# Patient Record
Sex: Male | Born: 1935 | Race: White | Hispanic: No | Marital: Married | Smoking: Never smoker
Health system: Southern US, Community
[De-identification: ages and names within clinical notes are randomized; demographics above are authoritative.]

## PROBLEM LIST (undated history)

## (undated) DIAGNOSIS — C801 Malignant (primary) neoplasm, unspecified: Secondary | ICD-10-CM

## (undated) DIAGNOSIS — Z66 Do not resuscitate: Secondary | ICD-10-CM

## (undated) DIAGNOSIS — N200 Calculus of kidney: Secondary | ICD-10-CM

## (undated) DIAGNOSIS — E079 Disorder of thyroid, unspecified: Secondary | ICD-10-CM

## (undated) HISTORY — DX: Calculus of kidney: N20.0

## (undated) HISTORY — PX: PROSTATE SURGERY: SHX751

## (undated) HISTORY — DX: Disorder of thyroid, unspecified: E07.9

## (undated) HISTORY — DX: Malignant (primary) neoplasm, unspecified: C80.1

## (undated) HISTORY — DX: Do not resuscitate: Z66

## (undated) HISTORY — PX: HEMORRHOID SURGERY: SHX153

---

## 1998-05-12 ENCOUNTER — Ambulatory Visit (HOSPITAL_COMMUNITY): Admission: RE | Admit: 1998-05-12 | Discharge: 1998-05-12 | Payer: Self-pay | Admitting: Interventional Cardiology

## 2004-06-04 ENCOUNTER — Ambulatory Visit: Payer: Self-pay | Admitting: Urology

## 2004-06-08 ENCOUNTER — Ambulatory Visit: Payer: Self-pay | Admitting: Urology

## 2005-07-22 ENCOUNTER — Ambulatory Visit: Payer: Self-pay | Admitting: Urology

## 2005-07-26 ENCOUNTER — Ambulatory Visit: Payer: Self-pay | Admitting: Urology

## 2005-08-17 ENCOUNTER — Other Ambulatory Visit: Payer: Self-pay

## 2005-08-17 ENCOUNTER — Ambulatory Visit: Payer: Self-pay | Admitting: Urology

## 2005-08-19 ENCOUNTER — Ambulatory Visit: Payer: Self-pay | Admitting: Urology

## 2005-10-12 ENCOUNTER — Ambulatory Visit: Payer: Self-pay | Admitting: Urology

## 2005-10-12 ENCOUNTER — Other Ambulatory Visit: Payer: Self-pay

## 2005-10-14 ENCOUNTER — Ambulatory Visit: Payer: Self-pay | Admitting: Urology

## 2006-03-01 DIAGNOSIS — C801 Malignant (primary) neoplasm, unspecified: Secondary | ICD-10-CM

## 2006-03-01 HISTORY — DX: Malignant (primary) neoplasm, unspecified: C80.1

## 2006-06-07 ENCOUNTER — Ambulatory Visit (HOSPITAL_COMMUNITY): Admission: RE | Admit: 2006-06-07 | Discharge: 2006-06-07 | Payer: Self-pay | Admitting: Urology

## 2006-06-28 ENCOUNTER — Encounter (INDEPENDENT_AMBULATORY_CARE_PROVIDER_SITE_OTHER): Payer: Self-pay | Admitting: Specialist

## 2006-06-28 ENCOUNTER — Inpatient Hospital Stay (HOSPITAL_COMMUNITY): Admission: RE | Admit: 2006-06-28 | Discharge: 2006-06-28 | Payer: Self-pay | Admitting: Urology

## 2006-07-12 ENCOUNTER — Ambulatory Visit: Admission: RE | Admit: 2006-07-12 | Discharge: 2006-11-27 | Payer: Self-pay | Admitting: Radiation Oncology

## 2006-09-12 ENCOUNTER — Ambulatory Visit: Admission: RE | Admit: 2006-09-12 | Discharge: 2006-11-27 | Payer: Self-pay | Admitting: Radiation Oncology

## 2006-10-05 LAB — URINALYSIS, MICROSCOPIC - CHCC
Nitrite: NEGATIVE
Protein: NEGATIVE mg/dL
pH: 5 (ref 4.6–8.0)

## 2007-11-22 ENCOUNTER — Ambulatory Visit: Payer: Self-pay | Admitting: Urology

## 2007-11-29 ENCOUNTER — Ambulatory Visit: Payer: Self-pay | Admitting: Urology

## 2007-12-08 ENCOUNTER — Ambulatory Visit: Payer: Self-pay | Admitting: Urology

## 2007-12-20 ENCOUNTER — Ambulatory Visit: Payer: Self-pay | Admitting: Urology

## 2007-12-21 ENCOUNTER — Ambulatory Visit: Payer: Self-pay | Admitting: Urology

## 2007-12-28 ENCOUNTER — Ambulatory Visit: Payer: Self-pay | Admitting: Urology

## 2008-01-24 ENCOUNTER — Ambulatory Visit: Payer: Self-pay | Admitting: Urology

## 2008-02-01 ENCOUNTER — Ambulatory Visit: Payer: Self-pay | Admitting: Urology

## 2008-07-24 ENCOUNTER — Ambulatory Visit: Payer: Self-pay | Admitting: Urology

## 2009-01-06 ENCOUNTER — Ambulatory Visit: Payer: Self-pay | Admitting: Urology

## 2009-07-07 ENCOUNTER — Ambulatory Visit: Payer: Self-pay | Admitting: General Practice

## 2009-08-28 ENCOUNTER — Ambulatory Visit: Payer: Self-pay | Admitting: General Practice

## 2009-10-10 ENCOUNTER — Ambulatory Visit: Payer: Self-pay | Admitting: Urology

## 2010-01-06 ENCOUNTER — Ambulatory Visit: Payer: Self-pay | Admitting: Urology

## 2010-07-14 ENCOUNTER — Ambulatory Visit: Payer: Self-pay | Admitting: Urology

## 2010-07-14 NOTE — Op Note (Signed)
NAME:  Dylan Perez, Dylan Perez NO.:  [id number]   MEDICAL RECORD NO.:  [id number]          PATIENT TYPE:  INP   LOCATION:  0005                         FACILITY:  [facility]   PHYSICIAN:  Boston Service, M.D.DATE OF BIRTH:  [birth year]   DATE OF PROCEDURE:  06/28/2006  DATE OF DISCHARGE:                               OPERATIVE REPORT   PREOPERATIVE DIAGNOSES:  1. Obstructive uropathy.  2. Adhesions within the prostatic urethra.  3. Benign prostatic hypertrophy.  4. Bladder stones.   POSTOPERATIVE DIAGNOSES:  1. Obstructive uropathy.  2. Adhesions within the prostatic urethra.  3. Benign prostatic hypertrophy.  4. Bladder stones.   Endoscopic photographs were taken.   PROCEDURES:  1. Cystoscopy.  2. Laser incision of adhesions within the prostatic urethra.  3. Evacuation of bladder stones.   SURGEON:  Boston Service, M.D.   ASSISTANT:  None.   ANESTHESIA:  General.   SPECIMENS:  Stones.   ESTIMATED BLOOD LOSS:  Minimal.   COMPLICATIONS:  None obvious.   DESCRIPTION OF PROCEDURE:  The patient was prepped and draped in the  dorsal lithotomy position after institution of adequate level of general  anesthesia.  Well lubricated 21-French panendoscope was gently inserted  at the urethral meatus, normal urethra and sphincter.  Prostate showed  evidence of prior resection as well as evidence of prior TUMT, unusual  appearance in that the prostatic urethra proximal to the verumontanum  was densely adherent to the anterior prostate, creating a channel fairly  narrow on either side of the adhesion.  In the office, the flexible  cystoscope had been able to negotiate around the adhesion into the  bladder and there were numerous bladder stones seen, however, I could  not negotiate the 21-French scope beyond the adhesions.  A 500 holmium  fiber was selected, positioned within the cystoscope.  A ureteral  catheter had been advanced through the sideport into the  bladder and the  adhesion was then taken down slowly over a period of about 15-20  minutes.  As soon as the adhesion was released, the posterior aspect of  the prostatic urethra appeared to descend.  There was no visible  obstruction at the bladder neck.  A fairly wide golf hole orifice on the  right and a more slit-like orifice on the left, numerous stones  identified within the bladder.  Laser fiber was put on standby.  Bladder  stones were then irrigated from the bladder.  Careful inspection of the  prostatic urethra indicated that repeat TURP or TUIP was probably not  necessary.  Bladder was filled to capacity.  Cystoscope was withdrawn,  22-French Foley was inserted with immediate return of several hundred mL  of clear irrigant.  Foley was left to straight drain.  The patient was  returned to recovery in satisfactory condition.           ______________________________  Boston Service, M.D.     RH/MEDQ  D:  06/28/2006  T:  06/28/2006  Job:  [id number]   cc:   Alan Mulder, M.D.  Fax: [phone]   Anola Gurney  Fax: [phone]

## 2010-07-14 NOTE — H&P (Signed)
NAME:  Dylan Perez, Dylan Perez NO.:  [id number]   MEDICAL RECORD NO.:  [id number]          PATIENT TYPE:  INP   LOCATION:  0005                         FACILITY:  [facility]   PHYSICIAN:  Boston Service, M.D.DATE OF BIRTH:  19-Aug-1920   DATE OF ADMISSION:  06/28/2006  DATE OF DISCHARGE:                              HISTORY & PHYSICAL   INDICATIONS:  An 77 year old white male, recently discovered prostate  cancer, obstructive uropathy and bladder stones, right and left renal  stones.   PROBLEM LIST:  1. Gratefully appreciate concise and relevant clinical notes forwarded      from [place].  Previous prostate biopsy 1998, 2000 and 2003, M.      Evelene Croon, MD, [place], [place].  Left ESWL and TUMT 2003, M.      Evelene Croon, MD, [place], [place].  Right ESWL in 2000, M.      Evelene Croon, MD, in [place].  2. First seen in [facility] 2008.  Ultrasound, KUB and a      showed an 11.8 cm right kidney with 8 and 11 mm calculi in the      lower pole, nonobstructive.  An 11.2 cm left kidney with 9 and 17      mm stones within the midpole, also nonobstructive.  Cystoscopy      showed adhesions within the prostatic urethra.  The area just      proximal to the verumontanum was adherent to the anterior prostate      and the patient had a collection of small bladder stones.  PSA was      19 for unexplained reasons, which prompted prostate ultrasound      May 27, 2006, showed a Gleason 7 adenocarcinoma, 20% on the      right.  3. Bone scan June 07, 2006:  Degenerative joint disease but no      metastatic disease.  CT June 07, 2006, showed an enlarged prostate      but no evidence of metastatic disease.  I had lengthy discussion      with this patient and his wife.  His daughter Claris Che has been a      Engineer, civil (consulting) here at [employer] for many, many years.  I have tried to      outline with them what I think are the risks and the benefits and      the alternatives associated  with treatment of his recently-      discovered prostate cancer, obstructive uropathy with bladder      stones, and currently unobstructed right and left renal stones.  We      agreed on cystoscopy under anesthesia with lysis of adhesions      within the prostate with TURP and TUIP if necessary, probable ERT      for CAP once the patient is well-recovered from TURP, and will      follow stones conservatively for now.   MEDICATIONS:  Levoxyl, metoprolol, Zocor, Flomax.  Aspirin was  discontinued.   ALLERGIES:  Denies.   HOSPITALIZATION AND SURGERY:  As indicated, follow-up with Dr. Johny Chess  for hypertension and hypothyroidism.  The patient had a recent stress  test here in [place] with Lyn Records, MD.   FAMILY MEDICAL HISTORY:  Dad died at 57, colon cancer in CVA.  Mom died  at 50, Alzheimer's.  The patient is accompanied by his four for  daughters.   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 [employer],  looks and acts younger than his stated age.  VITAL SIGNS:  BP 149/89, temperature is 97, pulse is 84, respirations  20.  Weight is stable at about 186.  HEAD AND NECK:  Negative adenopathy, negative bruit.  LUNGS:  Clear to P&A.  HEART:  Regular rate and rhythm without murmur or gallop.  Appreciate  input from Verdis Prime, MD, and recent stress test.  ABDOMEN:  Positive bowel sounds, soft, nontender, no evidence of  inguinal hernia,  GENITOURINARY:  PSA is 17.  RECTAL:  No evidence of nodularity.  Bilaterally descended testes.  No  evidence of recurrent hernia.  NEUROLOGIC:  Grossly intact.   PLAN:  Will proceed with surgery this a.m.           ______________________________  Boston Service, M.D.     RH/MEDQ  D:  06/28/2006  T:  06/28/2006  Job:  [id number]   cc:   Alan Mulder, M.D.  Fax: [phone]   Anola Gurney  Fax: [phone]

## 2010-10-02 ENCOUNTER — Ambulatory Visit: Payer: Self-pay | Admitting: Internal Medicine

## 2010-10-06 ENCOUNTER — Encounter: Payer: Self-pay | Admitting: Internal Medicine

## 2010-10-19 ENCOUNTER — Telehealth: Payer: Self-pay | Admitting: Internal Medicine

## 2010-10-19 MED ORDER — LEVOTHYROXINE SODIUM 75 MCG PO TABS: 75.0000 ug | ORAL_TABLET | Freq: Every day | ORAL | Status: DC

## 2010-10-19 NOTE — Telephone Encounter (Signed)
OK TO REFILL  WITH 3 ADDITIONAL REFILLS  

## 2010-10-19 NOTE — Telephone Encounter (Signed)
Patient called and requested a refill on Levoxyl 75 mcg.  He has an appt. In 3 months.

## 2010-11-19 ENCOUNTER — Other Ambulatory Visit: Payer: Self-pay | Admitting: Internal Medicine

## 2010-11-19 NOTE — Telephone Encounter (Signed)
Rx request sent to Dr. Darrick Huntsman

## 2010-11-20 MED ORDER — METOPROLOL TARTRATE 50 MG PO TABS: 25.0000 mg | ORAL_TABLET | Freq: Every day | ORAL | Status: DC

## 2011-01-18 ENCOUNTER — Encounter: Payer: Self-pay | Admitting: Internal Medicine

## 2011-01-19 ENCOUNTER — Ambulatory Visit (INDEPENDENT_AMBULATORY_CARE_PROVIDER_SITE_OTHER): Payer: Medicare Other | Admitting: Internal Medicine

## 2011-01-19 ENCOUNTER — Encounter: Payer: Self-pay | Admitting: Internal Medicine

## 2011-01-19 DIAGNOSIS — N200 Calculus of kidney: Secondary | ICD-10-CM | POA: Insufficient documentation

## 2011-01-19 DIAGNOSIS — R269 Unspecified abnormalities of gait and mobility: Secondary | ICD-10-CM

## 2011-01-19 DIAGNOSIS — R2681 Unsteadiness on feet: Secondary | ICD-10-CM

## 2011-01-19 DIAGNOSIS — Z23 Encounter for immunization: Secondary | ICD-10-CM

## 2011-01-19 DIAGNOSIS — Z9889 Other specified postprocedural states: Secondary | ICD-10-CM

## 2011-01-19 DIAGNOSIS — Z8546 Personal history of malignant neoplasm of prostate: Secondary | ICD-10-CM | POA: Insufficient documentation

## 2011-01-19 DIAGNOSIS — M6281 Muscle weakness (generalized): Secondary | ICD-10-CM

## 2011-01-19 NOTE — Assessment & Plan Note (Signed)
Persistent, with prior evaluation in  July revealing no signs of myopathy, b12 deficiency ,thyroid imbalance  etc.  I have suggested PT evaluation both for weakness and vestibular  fxn but he has declined at this time.

## 2011-01-19 NOTE — Patient Instructions (Signed)
I encourage you to walk fro 15 minutes every day on a level surface t keep yor muscles toned.  If you decide to try the Physical Therapy for dizzness,  I will be happy to refer you.  followup in 6 months.

## 2011-01-19 NOTE — Progress Notes (Signed)
Subjective:    Patient ID: Dylan Perez, male    DOB: 1920-06-12, 77 y.o.   MRN: [id number]  HPI  77 yo white male with history of prostate cancer diagnosed in 2008, treated with XRT/hormones, hypothyroidism presents for 6 month followup.  During his first visit in July he was describin progressive bilateral LE weakness and balance issues which has been persistent but for at least 8 months.  Hr denies falls, true vertigo,  orthostasis and back pain . No trouble rising from seated position but feels like he is falling backwards when going up a flight of stairs. His appetite remains poor acocroding to his wife; he disagrees,  Bur she notes conitnued weight loss.  Past Medical History  Diagnosis Date  . DNR (do not resuscitate)   . Cancer 2008    Prostate, treated with radiation and Lupron  . Nephrolithiasis     secondary nephrolithiasis,  [facility]  . hypothyroidism   . Nephrolithiasis     managed by [facility] in [facility]   Current Outpatient Prescriptions on File Prior to Visit  Medication Sig Dispense Refill  . aspirin 81 MG tablet Take 81 mg by mouth daily.        Marland Kitchen levothyroxine (SYNTHROID, LEVOTHROID) 75 MCG tablet Take 1 tablet (75 mcg total) by mouth daily.  30 tablet  3  . metoprolol (LOPRESSOR) 50 MG tablet Take 0.5 tablets (25 mg total) by mouth daily.  30 tablet  3  . Multiple Vitamin (MULTIVITAMIN) tablet Take 1 tablet by mouth daily.        . potassium chloride (MICRO-K) 10 MEQ CR capsule Take 10 mEq by mouth 2 (two) times daily.           Review of Systems  Constitutional: Negative for fever, chills, diaphoresis, activity change, appetite change, fatigue and unexpected weight change.  HENT: Negative for hearing loss, ear pain, nosebleeds, congestion, sore throat, facial swelling, rhinorrhea, sneezing, drooling, mouth sores, trouble swallowing, neck pain, neck stiffness, dental problem, voice change, postnasal drip, sinus pressure, tinnitus and ear discharge.     Eyes: Negative for photophobia, pain, discharge, redness, itching and visual disturbance.  Respiratory: Negative for apnea, cough, choking, chest tightness, shortness of breath, wheezing and stridor.   Cardiovascular: Negative for chest pain, palpitations and leg swelling.  Gastrointestinal: Negative for nausea, vomiting, abdominal pain, diarrhea, constipation, blood in stool, abdominal distention, anal bleeding and rectal pain.  Genitourinary: Negative for dysuria, urgency, frequency, hematuria, flank pain, decreased urine volume, scrotal swelling, difficulty urinating and testicular pain.  Musculoskeletal: Negative for myalgias, back pain, joint swelling, arthralgias and gait problem.  Skin: Negative for color change, rash and wound.  Neurological: Positive for dizziness and weakness. Negative for tremors, seizures, syncope, speech difficulty, light-headedness, numbness and headaches.  Psychiatric/Behavioral: Negative for suicidal ideas, hallucinations, behavioral problems, confusion, sleep disturbance, dysphoric mood, decreased concentration and agitation. The patient is not nervous/anxious.       BP 138/76  Pulse 62  Temp(Src) 97.6 F (36.4 C) (Oral)  Resp 16  Ht 5\' 8"  (1.727 m)  Wt 167 lb 8 oz (75.978 kg)  BMI 25.47 kg/m2  SpO2 97%  Objective:   Physical Exam  Constitutional: He is oriented to person, place, and time.  HENT:  Head: Normocephalic and atraumatic.  Mouth/Throat: Oropharynx is clear and moist.  Eyes: Conjunctivae and EOM are normal.  Neck: Normal range of motion. Neck supple. No JVD present. No thyromegaly present.  Cardiovascular: Normal rate, regular rhythm and normal  heart sounds.   Pulmonary/Chest: Effort normal and breath sounds normal. He has no wheezes. He has no rales.  Abdominal: Soft. Bowel sounds are normal. He exhibits no mass. There is no tenderness. There is no rebound.  Musculoskeletal: Normal range of motion. He exhibits no edema.  Neurological: He  is alert and oriented to person, place, and time. He displays abnormal reflex. No cranial nerve deficit. He exhibits abnormal muscle tone.  Reflex Scores:      Patellar reflexes are 0 on the right side and 0 on the left side.      Achilles reflexes are 0 on the right side and 0 on the left side.      Right leg distal strength 4/5  Skin: Skin is warm and dry.  Psychiatric: He has a normal mood and affect.          Assessment & Plan:

## 2011-02-03 ENCOUNTER — Ambulatory Visit: Payer: Self-pay | Admitting: Ophthalmology

## 2011-02-04 ENCOUNTER — Encounter: Payer: Self-pay | Admitting: Internal Medicine

## 2011-02-16 ENCOUNTER — Ambulatory Visit: Payer: Self-pay | Admitting: Ophthalmology

## 2011-02-18 ENCOUNTER — Telehealth: Payer: Self-pay | Admitting: *Deleted

## 2011-02-18 NOTE — Telephone Encounter (Signed)
Refill on levothyroxine #30

## 2011-02-19 MED ORDER — LEVOTHYROXINE SODIUM 75 MCG PO TABS: 75.0000 ug | ORAL_TABLET | Freq: Every day | ORAL | Status: DC

## 2011-02-19 NOTE — Telephone Encounter (Signed)
OK TO REFILL LEVOTHYROXINE  #30 3 REFILLS

## 2011-05-10 ENCOUNTER — Other Ambulatory Visit: Payer: Self-pay | Admitting: Internal Medicine

## 2011-05-10 MED ORDER — METOPROLOL TARTRATE 50 MG PO TABS: 25.0000 mg | ORAL_TABLET | Freq: Every day | ORAL | Status: DC

## 2011-06-23 ENCOUNTER — Other Ambulatory Visit: Payer: Self-pay | Admitting: Internal Medicine

## 2011-06-23 MED ORDER — LEVOTHYROXINE SODIUM 75 MCG PO TABS: 75.0000 ug | ORAL_TABLET | Freq: Every day | ORAL | Status: DC

## 2011-07-01 IMAGING — CR DG ABDOMEN 1V
1 series · 1 of 1 positions shown · non-contrast
Comparison: none

REASON FOR EXAM: Nephrolithiasis
COMMENTS:

[view not recorded]
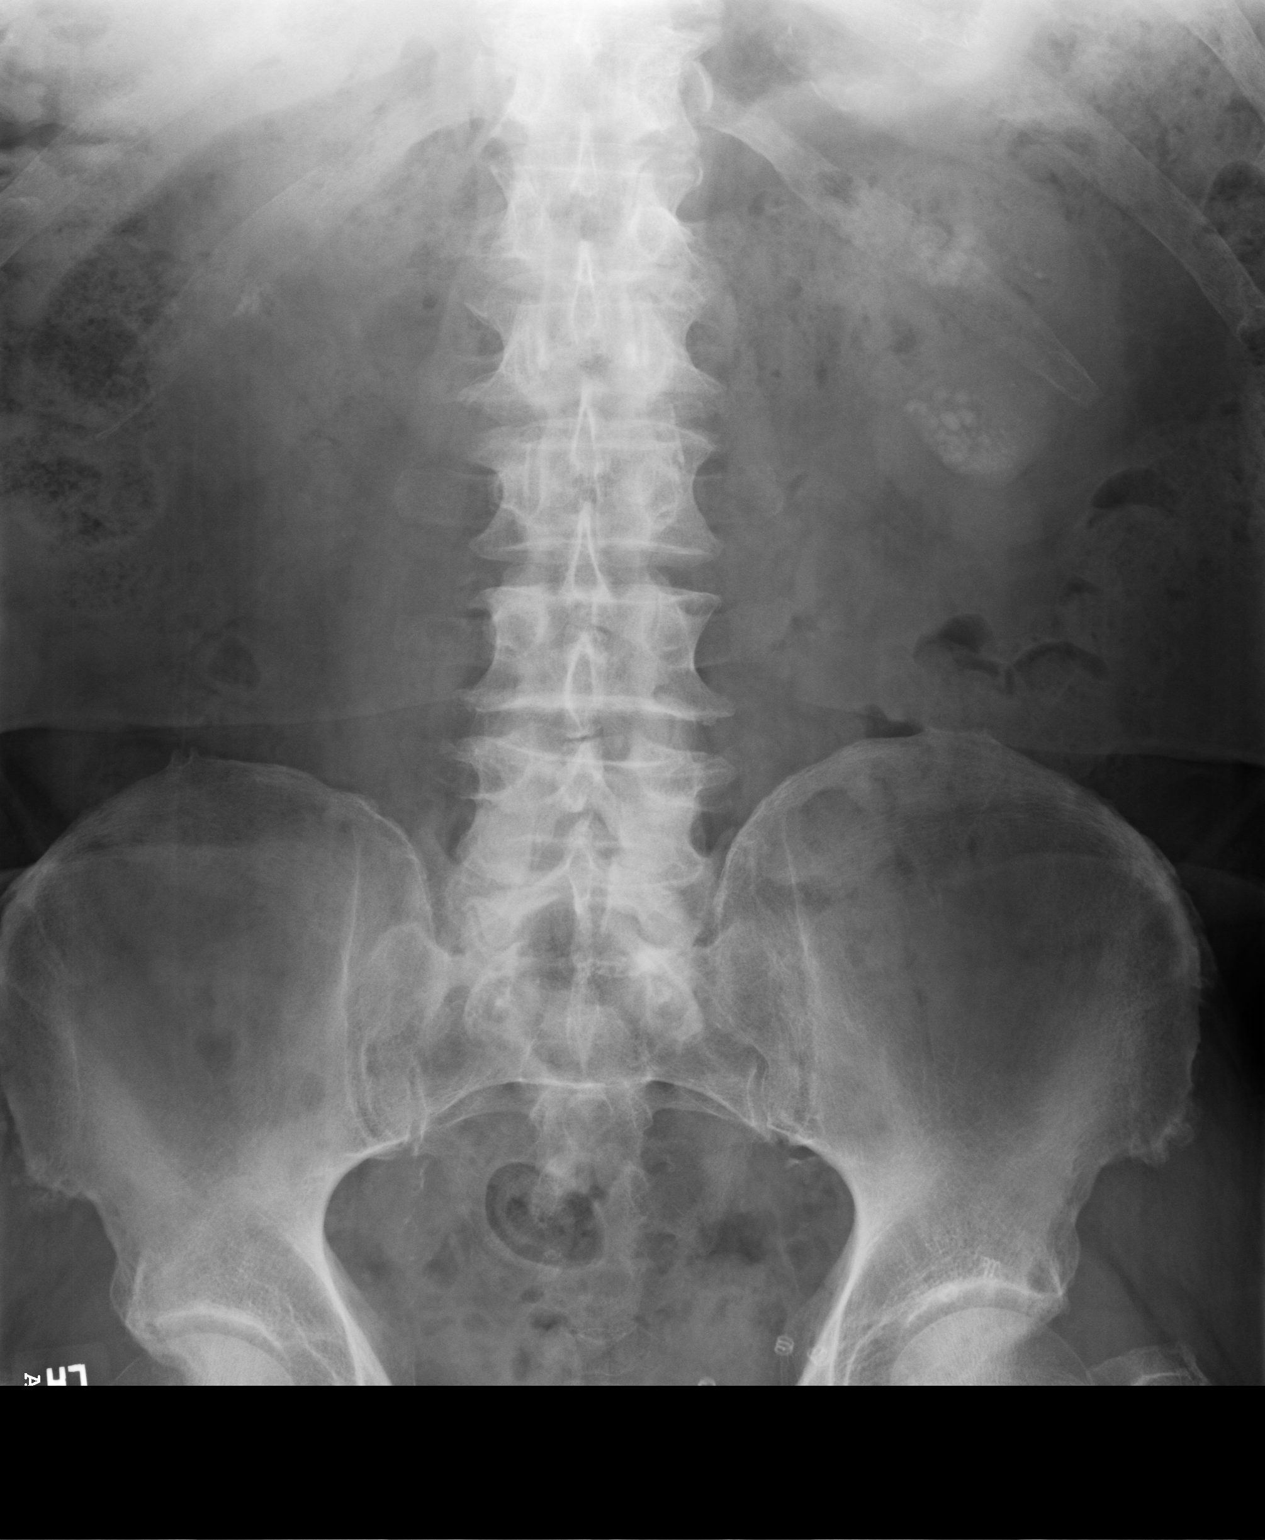

[1 of 1 positions shown; findings below may reference images not displayed]

PROCEDURE:     DXR - DXR KIDNEY URETER BLADDER  - January 06, 2010  [DATE]

RESULT:     Comparison is made to the prior exam of 08/28/2009. There are
again noted multiple calcifications projected over the lower pole and
midpole region of the left kidney. A cluster of calcifications is noted near
the midpole region on the right. No definite ureteral calcifications are
seen.
IMPRESSION: 1.     Bilateral nephrolithiasis.

## 2011-07-19 ENCOUNTER — Encounter: Payer: Self-pay | Admitting: Internal Medicine

## 2011-07-19 ENCOUNTER — Ambulatory Visit (INDEPENDENT_AMBULATORY_CARE_PROVIDER_SITE_OTHER): Payer: Medicare Other | Admitting: Internal Medicine

## 2011-07-19 VITALS — BP 133/79 | HR 82 | Temp 97.6°F | Resp 20 | Ht 68.0 in | Wt 171.8 lb

## 2011-07-19 DIAGNOSIS — I10 Essential (primary) hypertension: Secondary | ICD-10-CM

## 2011-07-19 DIAGNOSIS — E039 Hypothyroidism, unspecified: Secondary | ICD-10-CM

## 2011-07-19 DIAGNOSIS — R5383 Other fatigue: Secondary | ICD-10-CM

## 2011-07-19 DIAGNOSIS — E785 Hyperlipidemia, unspecified: Secondary | ICD-10-CM

## 2011-07-19 DIAGNOSIS — M6281 Muscle weakness (generalized): Secondary | ICD-10-CM

## 2011-07-19 DIAGNOSIS — Z0000 Encounter for general adult medical examination without abnormal findings: Secondary | ICD-10-CM | POA: Insufficient documentation

## 2011-07-19 LAB — CBC WITH DIFFERENTIAL/PLATELET
Basophils Relative: 0.4 % (ref 0.0–3.0)
Eosinophils Relative: 0.9 % (ref 0.0–5.0)
Hemoglobin: 16.2 g/dL (ref 13.0–17.0)
Lymphocytes Relative: 25.4 % (ref 12.0–46.0)
MCV: 100.4 fl — ABNORMAL HIGH (ref 78.0–100.0)
Monocytes Absolute: 0.7 10*3/uL (ref 0.1–1.0)
Neutrophils Relative %: 63 % (ref 43.0–77.0)
RBC: 4.87 Mil/uL (ref 4.22–5.81)
WBC: 6.6 10*3/uL (ref 4.5–10.5)

## 2011-07-19 LAB — COMPLETE METABOLIC PANEL WITH GFR
AST: 18 U/L (ref 0–37)
BUN: 21 mg/dL (ref 6–23)
Calcium: 9.5 mg/dL (ref 8.4–10.5)
Chloride: 105 mEq/L (ref 96–112)
Creat: 1.25 mg/dL (ref 0.50–1.35)
Total Bilirubin: 1 mg/dL (ref 0.3–1.2)

## 2011-07-19 LAB — LIPID PANEL
Cholesterol: 149 mg/dL (ref 0–200)
HDL: 32.1 mg/dL — ABNORMAL LOW (ref >39.00)
LDL Cholesterol: 96 mg/dL (ref 0–99)
Triglycerides: 103 mg/dL (ref 0.0–149.0)
VLDL: 20.6 mg/dL (ref 0.0–40.0)

## 2011-07-19 NOTE — Assessment & Plan Note (Deleted)
Pelvic was done without PAP. She is s/p total hysterectomy and LSO.

## 2011-07-19 NOTE — Progress Notes (Signed)
Patient ID: Dylan Perez, male   DOB: [birth year], 78 y.o.   MRN: [id number] Patient Active Problem List  Diagnoses  . Cancer  . Nephrolithiasis  . History of cardiac cath  . Muscle weakness (generalized)  . Hypertension    Subjective:  CC:   Chief Complaint  Patient presents with  . Follow-up    6 MONTH F/U    HPI:   Dylan Votaw Rogersis a 78 y.o. male who presents 6 month follow up on chronic issues of muscle weakness and prostate CA.  At last visit he was screened for myopathy and b12 deficiency and offered PT for treatment but declined.  Since that visit he has had one fall, several weeks ago. 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  His shoulder was sore and bruised but is improving.  He continues to report leg weakness but is not using a walker. He denies chest pain, dizziness and shortness of breath.  Appetite is excellent. Bowels and bladder are moving regularly.    Past Medical History  Diagnosis Date  . DNR (do not resuscitate)   . Cancer 2008    Prostate, treated with radiation and Lupron  . Nephrolithiasis     secondary nephrolithiasis,  [facility]  . hypothyroidism   . Nephrolithiasis     managed by [facility] in [facility]    Past Surgical History  Procedure Date  . Hemorrhoid surgery     banding  . Prostate surgery          The following portions of the patient's history were reviewed and updated as appropriate: Allergies, current medications, and problem list.    Review of Systems:   12 Pt  review of systems was negative except those addressed in the HPI,     History   Social History  . Marital Status: Married    Spouse Name: N/A    Number of Children: N/A  . Years of Education: N/A   Occupational History  . Not on file.   Social History Main Topics  . Smoking status: Never Smoker   . Smokeless tobacco: Never Used  . Alcohol Use: 0.5 oz/week    1 drink(s) per week    . Drug Use: No  . Sexually Active: Not on file   Other Topics Concern  . Not on file   Social History Narrative  . No narrative on file    Objective:  BP 133/79  Pulse 82  Temp(Src) 97.6 F (36.4 C) (Oral)  Resp 20  Ht 5\' 8"  (1.727 m)  Wt 171 lb 12 oz (77.905 kg)  BMI 26.11 kg/m2  SpO2 94%  General appearance: alert, cooperative and appears stated age Ears: normal TM's and external ear canals both ears Throat: lips, mucosa, and tongue normal; teeth and gums normal Neck: no adenopathy, no carotid bruit, supple, symmetrical, trachea midline and thyroid not enlarged, symmetric, no tenderness/mass/nodules Back: symmetric, no curvature. ROM normal. No CVA tenderness. Lungs: clear to auscultation bilaterally Heart: regular rate and rhythm, S1, S2 normal, no murmur, click, rub or gallop Abdomen: soft, non-tender; bowel sounds normal; no masses,  no organomegaly Pulses: 2+ and symmetric Skin: Skin color, texture, turgor normal. No rashes or lesions Lymph nodes: Cervical, supraclavicular, and axillary nodes normal.  Assessment and Plan:  Muscle weakness (generalized) No change.  We discussed that his symptoms may be a side effect of metoprolol, which he takes for hypertension.  He is only taking 25 mg daily of metoprolol tartrate.  Will stop it completely and add an alternative if needed.   Hypertension Previously mamanged with low dose metoprolol.  Dc'd today due to persistent muscle weakness. He will check bp daily after suspending and add alternative if needed.     Updated Medication List Outpatient Encounter Prescriptions as of 07/19/2011  Medication Sig Dispense Refill  . aspirin 81 MG tablet Take 81 mg by mouth daily.        Marland Kitchen levothyroxine (SYNTHROID, LEVOTHROID) 75 MCG tablet Take 1 tablet (75 mcg total) by mouth daily.  30 tablet  3  . metoprolol (LOPRESSOR) 50 MG tablet Take 0.5 tablets (25 mg total) by mouth daily.  30 tablet  3  . Multiple Vitamin (MULTIVITAMIN)  tablet Take 1 tablet by mouth daily.        . potassium chloride (MICRO-K) 10 MEQ CR capsule Take 10 mEq by mouth 2 (two) times daily.           Orders Placed This Encounter  Procedures  . TSH  . Lipid panel  . COMPLETE METABOLIC PANEL WITH GFR  . CBC with Differential  . CK    Return in about 3 months (around 10/19/2011).

## 2011-07-19 NOTE — Assessment & Plan Note (Addendum)
No change.  We discussed that his symptoms may be a side effect of metoprolol, which he takes for hypertension.  He is only taking 25 mg daily of metoprolol tartrate.  Will stop it completely and add an alternative if needed.

## 2011-07-19 NOTE — Assessment & Plan Note (Signed)
Previously mamanged with low dose metoprolol.  Dc'd today due to persistent muscle weakness. He will check bp daily after suspending and add alternative if needed.

## 2011-07-19 NOTE — Patient Instructions (Signed)
We are going to stop metoprolol for blood pressure for now   Check your blood pressure once daily along with pulse to make sure  We do not need to add anything    A good bp is 100 to 140 /  60 to 80  We are checking cholesterol,  Thyroid, etc today with labs,

## 2011-08-16 ENCOUNTER — Telehealth: Payer: Self-pay | Admitting: Internal Medicine

## 2011-08-16 DIAGNOSIS — M6281 Muscle weakness (generalized): Secondary | ICD-10-CM

## 2011-08-16 NOTE — Telephone Encounter (Signed)
Patients wife called and stated at his last visit you had mentioned that he try phyiscal therapy for weakness.  Patient did not want to try it then but now is willing to try it.  She wanted to know if he needed to come back in to be seen first or can you just order it.  Please advise.

## 2011-08-17 ENCOUNTER — Telehealth: Payer: Self-pay | Admitting: Internal Medicine

## 2011-08-17 NOTE — Telephone Encounter (Signed)
No, it can be ordered.  ;  I will order it through the hospital's PT

## 2011-08-17 NOTE — Telephone Encounter (Signed)
Patients wife notified

## 2011-08-18 NOTE — Telephone Encounter (Signed)
I have printed form waiting on a signature.

## 2011-08-19 ENCOUNTER — Ambulatory Visit: Payer: Self-pay | Admitting: Orthopedic Surgery

## 2011-08-23 NOTE — Telephone Encounter (Signed)
Order has been faxed

## 2011-09-24 ENCOUNTER — Encounter: Payer: Self-pay | Admitting: Internal Medicine

## 2011-10-04 ENCOUNTER — Telehealth: Payer: Self-pay | Admitting: Internal Medicine

## 2011-10-04 NOTE — Telephone Encounter (Signed)
Patient would like to continue getting physical therapy at [facility], he states that he was doing this up until last week. Please advise.

## 2011-10-04 NOTE — Telephone Encounter (Signed)
Order on printer

## 2011-10-04 NOTE — Telephone Encounter (Signed)
Order sent to [facility], patient notified.

## 2011-10-19 ENCOUNTER — Ambulatory Visit (INDEPENDENT_AMBULATORY_CARE_PROVIDER_SITE_OTHER): Payer: Medicare Other | Admitting: Internal Medicine

## 2011-10-19 ENCOUNTER — Encounter: Payer: Self-pay | Admitting: Internal Medicine

## 2011-10-19 VITALS — BP 132/80 | HR 74 | Temp 97.8°F | Resp 16 | Ht 67.0 in | Wt 170.3 lb

## 2011-10-19 DIAGNOSIS — Z0000 Encounter for general adult medical examination without abnormal findings: Secondary | ICD-10-CM

## 2011-10-19 DIAGNOSIS — C801 Malignant (primary) neoplasm, unspecified: Secondary | ICD-10-CM

## 2011-10-19 DIAGNOSIS — Z23 Encounter for immunization: Secondary | ICD-10-CM

## 2011-10-19 DIAGNOSIS — M6281 Muscle weakness (generalized): Secondary | ICD-10-CM

## 2011-10-19 DIAGNOSIS — Z9889 Other specified postprocedural states: Secondary | ICD-10-CM

## 2011-10-19 DIAGNOSIS — I10 Essential (primary) hypertension: Secondary | ICD-10-CM

## 2011-10-19 NOTE — Progress Notes (Signed)
Patient ID: Dylan Perez, male   DOB: December 15, 1920, 78 y.o.   MRN: [id number]  The patient is here for annual Medicare wellness examination and management of other chronic and acute problems.   The risk factors are reflected in the social history.  The roster of all physicians providing medical care to patient - is listed in the Snapshot section of the chart.  Activities of daily living:  The patient is 100% independent in all ADLs: dressing, toileting, feeding as well as independent mobility  Home safety : The patient has smoke detectors in the home. They wear seatbelts.  There are no firearms at home. There is no violence in the home.   There is no risks for hepatitis, STDs or HIV. There is no   history of blood transfusion. They have no travel history to infectious disease endemic areas of the world.  The patient has seen their dentist in the last six month. They have seen their eye doctor in the last year. They admit to slight hearing difficulty with regard to whispered voices and some television programs.  They have deferred audiologic testing in the last year.  They do not  have excessive sun exposure. Discussed the need for sun protection: hats, long sleeves and use of sunscreen if there is significant sun exposure.   Diet: the importance of a healthy diet is discussed. They do have a healthy diet.  The benefits of regular aerobic exercise were discussed. She walks 4 times per week ,  20 minutes.   Depression screen: there are no signs or vegative symptoms of depression- irritability, change in appetite, anhedonia, sadness/tearfullness.  Cognitive assessment: the patient manages all their financial and personal affairs and is actively engaged. They could relate day,date,year and events; recalled 2/3 objects at 3 minutes; performed clock-face test normally.  The following portions of the patient's history were reviewed and updated as appropriate: allergies, current medications, past  family history, past medical history,  past surgical history, past social history  and problem list.  Visual acuity was not assessed per patient preference since she has regular follow up with her ophthalmologist. Hearing and body mass index were assessed and reviewed.   During the course of the visit the patient was educated and counseled about appropriate screening and preventive services including : fall prevention , diabetes screening, nutrition counseling, colorectal cancer screening, and recommended immunizations.    BP 132/80  Pulse 74  Temp 97.8 F (36.6 C) (Oral)  Resp 16  Ht 5\' 7"  (1.702 m)  Wt 170 lb 5 oz (77.253 kg)  BMI 26.67 kg/m2  SpO2 93% General appearance: alert, cooperative and appears stated age Neck: no adenopathy, no carotid bruit, no JVD, supple, symmetrical, trachea midline and thyroid not enlarged, symmetric, no tenderness/mass/nodules Lungs: clear to auscultation bilaterally Heart: regular rate and rhythm, S1, S2 normal, no murmur, click, rub or gallop Abdomen: soft, non-tender; bowel sounds normal; no masses,  no organomegaly Extremities: extremities normal, atraumatic, no cyanosis or edema Skin: Skin color, texture, turgor normal. No rashes or lesions Neurologic: Alert and oriented X 3, normal strength and tone. Normal symmetric reflexes. Normal coordination and gait  Assessment and Plan  Muscle weakness (generalized) Exam is normal.  Recommended cardiopulmonary rehab to increase hs stamina  Cancer Prostate, treated with radiation and Lupron  Hypertension Well controlled on current regimen. Renal function stable, no changes today.  History of cardiac cath Done reoperatively at age 22 for prostate Ca surgery.  No blockages noted  Updated Medication List Outpatient Encounter Prescriptions as of 10/19/2011  Medication Sig Dispense Refill  . aspirin 81 MG tablet Take 81 mg by mouth daily.        Marland Kitchen levothyroxine (SYNTHROID, LEVOTHROID) 75 MCG tablet  Take 1 tablet (75 mcg total) by mouth daily.  30 tablet  3  . Multiple Vitamin (MULTIVITAMIN) tablet Take 1 tablet by mouth daily.        . potassium chloride (MICRO-K) 10 MEQ CR capsule Take 10 mEq by mouth 2 (two) times daily.        . solifenacin (VESICARE) 10 MG tablet Take 10 mg by mouth daily.      Marland Kitchen DISCONTD: metoprolol (LOPRESSOR) 50 MG tablet Take 0.5 tablets (25 mg total) by mouth daily.  30 tablet  3

## 2011-10-19 NOTE — Assessment & Plan Note (Signed)
Prostate, treated with radiation and Lupron

## 2011-10-19 NOTE — Patient Instructions (Addendum)
You received your TdaP vaccine today  Your blood pressures are fine.  Yo do not need to resume metoprolol  We will resend the referral for PT

## 2011-10-19 NOTE — Assessment & Plan Note (Signed)
Well controlled on current regimen. Renal function stable, no changes today. 

## 2011-10-19 NOTE — Assessment & Plan Note (Signed)
Done reoperatively at age 78 for prostate Ca surgery.  No blockages noted

## 2011-10-19 NOTE — Assessment & Plan Note (Signed)
Exam is normal.  Recommended cardiopulmonary rehab to increase hs stamina

## 2011-10-26 ENCOUNTER — Other Ambulatory Visit: Payer: Self-pay | Admitting: *Deleted

## 2011-10-26 MED ORDER — LEVOTHYROXINE SODIUM 75 MCG PO TABS: 75.0000 ug | ORAL_TABLET | Freq: Every day | ORAL | Status: DC

## 2011-12-16 ENCOUNTER — Encounter: Payer: Self-pay | Admitting: Internal Medicine

## 2011-12-31 ENCOUNTER — Encounter: Payer: Self-pay | Admitting: Internal Medicine

## 2012-01-30 ENCOUNTER — Encounter: Payer: Self-pay | Admitting: Internal Medicine

## 2012-02-25 ENCOUNTER — Other Ambulatory Visit: Payer: Self-pay | Admitting: Internal Medicine

## 2012-02-25 MED ORDER — LEVOTHYROXINE SODIUM 75 MCG PO TABS: 75.0000 ug | ORAL_TABLET | Freq: Every day | ORAL | Status: DC

## 2012-02-25 NOTE — Telephone Encounter (Signed)
Med filled.  

## 2012-02-25 NOTE — Telephone Encounter (Signed)
Refill on Levothyroxine Sodium 75 mg tab #30

## 2012-03-01 HISTORY — PX: CATARACT EXTRACTION: SUR2

## 2012-04-04 ENCOUNTER — Ambulatory Visit: Payer: Self-pay | Admitting: Ophthalmology

## 2012-04-11 ENCOUNTER — Ambulatory Visit: Payer: Self-pay | Admitting: Ophthalmology

## 2012-04-21 ENCOUNTER — Encounter: Payer: Self-pay | Admitting: Internal Medicine

## 2012-04-21 ENCOUNTER — Ambulatory Visit (INDEPENDENT_AMBULATORY_CARE_PROVIDER_SITE_OTHER): Payer: Medicare Other | Admitting: Internal Medicine

## 2012-04-21 VITALS — BP 128/80 | HR 80 | Temp 97.9°F | Resp 16 | Wt 179.8 lb

## 2012-04-21 DIAGNOSIS — R5381 Other malaise: Secondary | ICD-10-CM

## 2012-04-21 DIAGNOSIS — R5383 Other fatigue: Secondary | ICD-10-CM

## 2012-04-21 DIAGNOSIS — E559 Vitamin D deficiency, unspecified: Secondary | ICD-10-CM

## 2012-04-21 DIAGNOSIS — I10 Essential (primary) hypertension: Secondary | ICD-10-CM

## 2012-04-21 DIAGNOSIS — E039 Hypothyroidism, unspecified: Secondary | ICD-10-CM

## 2012-04-21 DIAGNOSIS — M6281 Muscle weakness (generalized): Secondary | ICD-10-CM

## 2012-04-21 DIAGNOSIS — Z8546 Personal history of malignant neoplasm of prostate: Secondary | ICD-10-CM

## 2012-04-21 LAB — COMPREHENSIVE METABOLIC PANEL
ALT: 9 U/L (ref 0–53)
CO2: 26 mEq/L (ref 19–32)
Calcium: 9.2 mg/dL (ref 8.4–10.5)
Chloride: 105 mEq/L (ref 96–112)
GFR: 61.75 mL/min (ref >60.00)
Sodium: 137 mEq/L (ref 135–145)
Total Protein: 7.1 g/dL (ref 6.0–8.3)

## 2012-04-21 LAB — CBC WITH DIFFERENTIAL/PLATELET
Basophils Absolute: 0 10*3/uL (ref 0.0–0.1)
Lymphocytes Relative: 25.2 % (ref 12.0–46.0)
Monocytes Relative: 9.9 % (ref 3.0–12.0)
Platelets: 173 10*3/uL (ref 150.0–400.0)
RDW: 13.4 % (ref 11.5–14.6)

## 2012-04-21 LAB — MICROALBUMIN / CREATININE URINE RATIO
Creatinine,U: 134.7 mg/dL
Microalb, Ur: 3.1 mg/dL — ABNORMAL HIGH (ref 0.0–1.9)

## 2012-04-21 LAB — TSH: TSH: 1.8 u[IU]/mL (ref 0.35–5.50)

## 2012-04-21 NOTE — Progress Notes (Signed)
Patient ID: Dylan Perez, male   DOB: Jul 08, 1920, 79 y.o.   MRN: [id number]  Patient Active Problem List  Diagnosis  . Personal history of prostate cancer  . Nephrolithiasis  . History of cardiac cath  . Muscle weakness (generalized)  . Vitamin D deficiency  . Unspecified hypothyroidism    Subjective:  CC:   Chief Complaint  Patient presents with  . Follow-up    HPI:   Dylan Molchan Rogersis a 79 y.o. male who presents for  6 month follow up on chronic  Conditions including hypothyroidism, prostate CA and proximal muscle weakness.   He has had several procedures done since last visit and is bothered by ocular irritation and blurred vision in his left eye after cataract was removed last Tuesday by Dr. Druscilla Brownie.   At follow up his orbital pressure was elevated to 57 and new eye drops were prescribed.  The pressure had improved at subsequent visit but not at goal.  No explatation given to patient and Dylan Perez is quite upset.  More drops were prescribed. Eye still feeling irritated and his vision is still very reduced, even with glasses on he cannot read printed words unles the font is over 24. The two new eye drops were not brought to appt can they cannot remember the names).    2) Urinary hesitancy.  He saw Urology last month and was prescribed rapaflo along with cipro and has follow up with urologist Dr  Laverle Patter next Thursday . He has not noted any improvement yet and is requesting a prescription for cialis and "low T".  His wife is not supportive of the cialis request.   3) His weakness has improved and he has had no recent falls.  Denies dizziness.  He has been screened for myopathy and b12 deficiency and offered PT for treatment but declined.  He is not using a walker. He denies chest pain, dizziness and shortness of breath.  Appetite is excellent. Bowels are moving regularly. He is not using the vesicare anymore.   Past Medical History  Diagnosis Date  . DNR (do not resuscitate)   .  Cancer 2008    Prostate, treated with radiation and Lupron  . Nephrolithiasis     secondary nephrolithiasis,  [facility]  . hypothyroidism   . Nephrolithiasis     managed by [facility] in [facility]    Past Surgical History  Procedure Laterality Date  . Hemorrhoid surgery      banding  . Prostate surgery    . Cataract extraction  2014    Left eye       The following portions of the patient's history were reviewed and updated as appropriate: Allergies, current medications, and problem list.    Review of Systems:   Patient denies headache, fevers, malaise, unintentional weight loss, skin rash, eye pain, sinus congestion and sinus pain, sore throat, dysphagia,  hemoptysis , cough, dyspnea, wheezing, chest pain, palpitations, orthopnea, edema, abdominal pain, nausea, melena, diarrhea, constipation, flank pain, dysuria, hematuria, urinary  Frequency, nocturia, numbness, tingling, seizures,  Focal weakness, Loss of consciousness,  Tremor, insomnia, depression, anxiety, and suicidal ideation.      History   Social History  . Marital Status: Married    Spouse Name: N/A    Number of Children: N/A  . Years of Education: N/A   Occupational History  . Not on file.   Social History Main Topics  . Smoking status: Never Smoker   . Smokeless tobacco: Never Used  .  Alcohol Use: 0.5 oz/week    1 drink(s) per week  . Drug Use: No  . Sexually Active: Not on file   Other Topics Concern  . Not on file   Social History Narrative  . No narrative on file    Objective:  BP 128/80  Pulse 80  Temp(Src) 97.9 F (36.6 C) (Oral)  Resp 16  Wt 179 lb 12 oz (81.534 kg)  BMI 28.15 kg/m2  SpO2 97%  General appearance: alert, cooperative and appears stated age Ears: normal TM's and external ear canals both ears Throat: lips, mucosa, and tongue normal; teeth and gums normal Neck: no adenopathy, no carotid bruit, supple, symmetrical, trachea midline and thyroid not enlarged,  symmetric, no tenderness/mass/nodules Back: symmetric, no curvature. ROM normal. No CVA tenderness. Lungs: clear to auscultation bilaterally Heart: regular rate and rhythm, S1, S2 normal, no murmur, click, rub or gallop Abdomen: soft, non-tender; bowel sounds normal; no masses,  no organomegaly Pulses: 2+ and symmetric Skin: Skin color, texture, turgor normal. No rashes or lesions Lymph nodes: Cervical, supraclavicular, and axillary nodes normal.  Assessment and Plan:  Vitamin D deficiency level was 19. Drisdol sent to pharmacy for 3 months given his prior history of falls.   Muscle weakness (generalized) Probably secondary to low T .  Not progressive.  Rejected offer for PT.  No recent falls.  No evidence of myopathy or neuropathy on exam.    Personal history of prostate cancer Follow up with his urologist next month. Discussed his request for cialis an dhis problem of low testosterone in the context of androgen deprivation therapy which he has had .  He is not sexually active and his wife does not want him to have it.  Deferred for now.   Unspecified hypothyroidism Thyroid function is normal on current dose. No changes today    Updated Medication List Outpatient Encounter Prescriptions as of 04/21/2012  Medication Sig Dispense Refill  . aspirin 81 MG tablet Take 81 mg by mouth daily.        Marland Kitchen levothyroxine (SYNTHROID, LEVOTHROID) 75 MCG tablet Take 1 tablet (75 mcg total) by mouth daily.  30 tablet  3  . Multiple Vitamin (MULTIVITAMIN) tablet Take 1 tablet by mouth daily.        . solifenacin (VESICARE) 10 MG tablet Take 10 mg by mouth daily.      . ergocalciferol (DRISDOL) 50000 UNITS capsule Take 1 capsule (50,000 Units total) by mouth once a week.  12 capsule  0  . potassium chloride (MICRO-K) 10 MEQ CR capsule Take 10 mEq by mouth 2 (two) times daily.         No facility-administered encounter medications on file as of 04/21/2012.     Orders Placed This Encounter   Procedures  . Microalbumin / creatinine urine ratio  . TSH  . Vitamin D 25 hydroxy  . CBC with Differential  . Comprehensive metabolic panel    No Follow-up on file.

## 2012-04-21 NOTE — Patient Instructions (Addendum)
You are in great shape for your age!  Keep walking every day to keep your muscles strong  Return in 6 months for your annual Medicare wellness exam

## 2012-04-22 ENCOUNTER — Encounter: Payer: Self-pay | Admitting: Internal Medicine

## 2012-04-22 DIAGNOSIS — E039 Hypothyroidism, unspecified: Secondary | ICD-10-CM | POA: Insufficient documentation

## 2012-04-22 DIAGNOSIS — E559 Vitamin D deficiency, unspecified: Secondary | ICD-10-CM | POA: Insufficient documentation

## 2012-04-22 LAB — VITAMIN D 25 HYDROXY (VIT D DEFICIENCY, FRACTURES): Vit D, 25-Hydroxy: 19 ng/mL — ABNORMAL LOW (ref 30–89)

## 2012-04-22 MED ORDER — ERGOCALCIFEROL 1.25 MG (50000 UT) PO CAPS: 50000.0000 [IU] | ORAL_CAPSULE | ORAL | Status: AC

## 2012-04-22 NOTE — Assessment & Plan Note (Signed)
Follow up with his urologist next month. Discussed his request for cialis an dhis problem of low testosterone in the context of androgen deprivation therapy which he has had .  He is not sexually active and his wife does not want him to have it.  Deferred for now.

## 2012-04-22 NOTE — Assessment & Plan Note (Signed)
Thyroid function is normal on current dose. No changes today

## 2012-04-22 NOTE — Assessment & Plan Note (Addendum)
Probably secondary to low T .  Not progressive.  Rejected offer for PT.  No recent falls.  No evidence of myopathy or neuropathy on exam.

## 2012-04-22 NOTE — Assessment & Plan Note (Addendum)
level was 19. Drisdol sent to pharmacy for 3 months given his prior history of falls.

## 2012-07-05 ENCOUNTER — Other Ambulatory Visit: Payer: Self-pay | Admitting: *Deleted

## 2012-07-06 MED ORDER — LEVOTHYROXINE SODIUM 75 MCG PO TABS: 75.0000 ug | ORAL_TABLET | Freq: Every day | ORAL | Status: AC

## 2012-10-30 DEATH — deceased

## 2012-10-31 ENCOUNTER — Encounter: Payer: Medicare Other | Admitting: Internal Medicine

## 2014-06-21 NOTE — Op Note (Signed)
PATIENT NAME:  Dylan Perez, Dylan Perez MR#:  [id number] DATE OF BIRTH:  02-Jul-1920  DATE OF PROCEDURE:  04/11/2012  PREOPERATIVE DIAGNOSIS: Visually significant cataract of the left eye.   POSTOPERATIVE DIAGNOSIS: Visually significant cataract of the left eye.   OPERATIVE PROCEDURE: Cataract extraction by phacoemulsification with implant of intraocular lens to left eye.   SURGEON: Birder Robson, MD.   ANESTHESIA:  1. Managed anesthesia care.  2. Topical tetracaine drops followed by 2% Xylocaine jelly applied in the preoperative holding area.   COMPLICATIONS: None.   TECHNIQUE:  Stop and chop.   DESCRIPTION OF PROCEDURE: The patient was examined and consented in the preoperative holding area where the aforementioned topical anesthesia was applied to the left eye and then brought back to the Operating Room where the left eye was prepped and draped in the usual sterile ophthalmic fashion and a lid speculum was placed. A paracentesis was created with the side port blade and the anterior chamber was filled with viscoelastic. A near clear corneal incision was performed with the steel keratome. A continuous curvilinear capsulorrhexis was performed with a cystotome followed by the capsulorrhexis forceps. Hydrodissection and hydrodelineation were carried out with BSS on a blunt cannula. The lens was removed in a stop and chop technique and the remaining cortical material was removed with the irrigation-aspiration handpiece. The capsular bag was inflated with viscoelastic and the Alcon SN60WF Tecnis ZCB00 17.5-diopter lens, serial number [id number] was placed in the capsular bag without complication. The remaining viscoelastic was removed from the eye with the irrigation-aspiration handpiece. The wounds were hydrated. The anterior chamber was flushed with Miostat and the eye was inflated to physiologic pressure. 0.1 mL of cefuroxime concentration 10 mg/mL was placed in the anterior chamber. The wounds were found  to be water tight. The eye was dressed with Vigamox. The patient was given protective glasses to wear throughout the day and a shield with which to sleep tonight. The patient was also given drops with which to begin a drop regimen today and will follow-up with me in one day.     ____________________________ Livingston Diones. Matthews Franks, MD wlp:ea D: 04/11/2012 22:53:14 ET T: 04/12/2012 04:15:44 ET JOB#: [id number]  cc: Kuper Rennels L. Karthika Glasper, MD, <Dictator> Livingston Diones Ananya Mccleese MD ELECTRONICALLY SIGNED 04/24/2012 11:53

## 2014-06-23 NOTE — Op Note (Signed)
PATIENT NAME:  Dylan Perez, Dylan Perez MR#:  [id number] DATE OF BIRTH:  07-05-1920  DATE OF PROCEDURE:  02/16/2011  PREOPERATIVE DIAGNOSIS: Visually significant cataract of the right eye.   POSTOPERATIVE DIAGNOSIS: Visually significant cataract of the right eye.   OPERATIVE PROCEDURE: Cataract extraction by phacoemulsification with implant of intraocular lens to right eye.   SURGEON: Birder Robson, MD.   ANESTHESIA:  1. Managed anesthesia care.  2. Topical tetracaine drops followed by 2% Xylocaine jelly applied in the preoperative holding area.   COMPLICATIONS: None.   TECHNIQUE:  Stop and chop.   DESCRIPTION OF PROCEDURE: The patient was examined and consented in the preoperative holding area where the aforementioned topical anesthesia was applied to the right eye and then brought back to the Operating Room where the right eye was prepped and draped in the usual sterile ophthalmic fashion and a lid speculum was placed. A paracentesis was created with the side port blade and the anterior chamber was filled with viscoelastic. A near clear corneal incision was performed with the steel keratome. A continuous curvilinear capsulorrhexis was performed with a cystotome followed by the capsulorrhexis forceps. Hydrodissection and hydrodelineation were carried out with BSS on a blunt cannula. The lens was removed in a stop and chop technique and the remaining cortical material was removed with the irrigation-aspiration handpiece. The capsular bag was inflated with viscoelastic and the Technis ZCB00 19.0-diopter lens, serial number [id number] was placed in the capsular bag without complication. The remaining viscoelastic was removed from the eye with the irrigation-aspiration handpiece. The wounds were hydrated. The anterior chamber was flushed with Miostat and the eye was inflated to physiologic pressure. The wounds were found to be water tight. The eye was dressed with Vigamox and Omnipred. The patient was  given protective glasses to wear throughout the day and a shield with which to sleep tonight. The patient was also given drops with which to begin a drop regimen today and will follow-up with me in one day.   ____________________________ Livingston Diones. Cionna Collantes, MD wlp:cms D: 02/16/2011 11:47:03 ET T: 02/16/2011 11:50:11 ET JOB#: [id number]  cc: Elisabella Hacker L. Tharun Cappella, MD, <Dictator>  Livingston Diones Meridian Scherger MD ELECTRONICALLY SIGNED 03/03/2011 12:34
